# Patient Record
Sex: Male | Born: 1985 | Race: White | Hispanic: No | Marital: Married | Smoking: Never smoker
Health system: Southern US, Community
[De-identification: ages and names within clinical notes are randomized; demographics above are authoritative.]

## PROBLEM LIST (undated history)

## (undated) DIAGNOSIS — J45909 Unspecified asthma, uncomplicated: Secondary | ICD-10-CM

## (undated) DIAGNOSIS — T7840XA Allergy, unspecified, initial encounter: Secondary | ICD-10-CM

## (undated) DIAGNOSIS — M1990 Unspecified osteoarthritis, unspecified site: Secondary | ICD-10-CM

## (undated) HISTORY — DX: Unspecified osteoarthritis, unspecified site: M19.90

## (undated) HISTORY — PX: SPINE SURGERY: SHX786

## (undated) HISTORY — DX: Unspecified asthma, uncomplicated: J45.909

## (undated) HISTORY — DX: Allergy, unspecified, initial encounter: T78.40XA

---
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
## 2007-01-01 ENCOUNTER — Emergency Department (HOSPITAL_COMMUNITY): Admission: EM | Admit: 2007-01-01 | Discharge: 2007-01-01 | Payer: Self-pay | Admitting: Emergency Medicine

## 2010-11-02 NOTE — Op Note (Signed)
[place]  Patient:    Donald Chan, Donald Chan Visit Number: [id number] MRN: [id number]          Service Type: SUR Location: [id number] Attending Physician:  Barton Fanny Dictated by:   Hewitt Shorts, M.D. Proc. 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  OPERATION PERFORMED:  Right L4-5 lumbar laminotomy and microdiskectomy.  SURGEON:  Hewitt Shorts, M.D.  ANESTHESIA:  General endotracheal.  INDICATIONS FOR PROCEDURE:  The patient is a 25 year old male who presented with right lumbar radiculopathy and was found by MRI scan to have a large right L4-5 lumbar disk herniation.  The decision was made to proceed with elective laminotomy and microdiskectomy.  DESCRIPTION OF PROCEDURE:  The patient was brought to the operating room and placed under general endotracheal anesthesia.  The patient was turned to a prone position and the lumbar region was prepped with Betadine soap and solution and draped in a sterile fashion.  The midline was infiltrated with local anesthetic with epinephrine.  A midline incision was made after an x-ray had been taken for localization.  Dissection was carried down to the subcutaneous tissue through the lumbodorsal fascia, which was incised on the right side of the midline and the paraspinal muscles were dissected from the spinous process of the lamina in subperiosteal fashion.  The L4-5 level was identified by x-ray and laminotomy was performed using the [facility] Max drill. The microscope was draped and brought into the field to provide additional magnification, illumination and visualization.  The remainder of the procedure was performed using microdissection and microsurgical technique.  The ligamentum flavum was  carefully resected and the thecal sac and right L5 nerve root were identified.  The epidural space was examined and a large disk herniation identified.  The thecal sac and nerve root were gently retracted medially.  Epidural veins were coagulated as necessary with bipolar cautery and then the disk fragment was carefully dissected from the surrounding epidural tissues and removed in several large fragments.  We then entered into the disk space and removed additional degenerated disk material.  All of the disk herniation within the epidural space was removed with good decompression of the thecal sac and nerve root.  All loose fragments of disk material were removed also from within the disk space.  Bipolar cautery was used to establish hemostasis and once the diskectomy was completed.  Hemostasis established. We instilled 2 cc of fentanyl and 80 mg of Depo-Medrol into the epidural space and proceeded with closure.  The deep fascia was closed with interrupted undyed 0 Vicryl sutures.  The subcutaneous and subcuticular layer were closed with interrupted inverted 2-0 undyed Vicryl sutures.  The skin was reapproximated with Dermabond.  The patient tolerated the procedure well. Estimated blood loss was less than 50 cc.  The sponge and needle counts were correct.  Following surgery, the patient was turned back to the supine position to be reversed from anesthetic and extubated.  He was transferred to the recovery room for further care. Dictated by:   Hewitt Shorts, M.D. Attending Physician:  Barton Fanny DD:  12/28/01 TD:  12/30/01 Job: [id number]

## 2011-04-01 LAB — POCT CARDIAC MARKERS
CKMB, poc: 1.5
Myoglobin, poc: 70.8
Operator id: 272551
Troponin i, poc: <0.05

## 2011-04-01 LAB — D-DIMER, QUANTITATIVE: D-Dimer, Quant: <0.22

## 2015-07-03 ENCOUNTER — Ambulatory Visit (INDEPENDENT_AMBULATORY_CARE_PROVIDER_SITE_OTHER): Payer: Self-pay

## 2015-07-03 ENCOUNTER — Ambulatory Visit (INDEPENDENT_AMBULATORY_CARE_PROVIDER_SITE_OTHER): Payer: Self-pay | Admitting: Emergency Medicine

## 2015-07-03 VITALS — BP 142/98 | HR 95 | Temp 98.4°F | Resp 18 | Ht 73.5 in | Wt 342.0 lb

## 2015-07-03 DIAGNOSIS — S335XXA Sprain of ligaments of lumbar spine, initial encounter: Secondary | ICD-10-CM

## 2015-07-03 DIAGNOSIS — Z6841 Body Mass Index (BMI) 40.0 and over, adult: Secondary | ICD-10-CM | POA: Insufficient documentation

## 2015-07-03 DIAGNOSIS — S161XXA Strain of muscle, fascia and tendon at neck level, initial encounter: Secondary | ICD-10-CM

## 2015-07-03 MED ORDER — HYDROCODONE-ACETAMINOPHEN 5-325 MG PO TABS: 1.0000 | ORAL_TABLET | ORAL | Status: AC | PRN

## 2015-07-03 MED ORDER — CYCLOBENZAPRINE HCL 10 MG PO TABS: 10.0000 mg | ORAL_TABLET | Freq: Three times a day (TID) | ORAL | Status: AC | PRN

## 2015-07-03 MED ORDER — NAPROXEN SODIUM 550 MG PO TABS: 550.0000 mg | ORAL_TABLET | Freq: Two times a day (BID) | ORAL | Status: AC
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
## 2015-07-03 NOTE — Patient Instructions (Signed)
Lumbosacral Strain  Lumbosacral strain is a strain of any of the parts that make up your lumbosacral vertebrae. Your lumbosacral vertebrae are the bones that make up the lower third of your backbone. Your lumbosacral vertebrae are held together by muscles and tough, fibrous tissue (ligaments).   CAUSES   A sudden blow to your back can cause lumbosacral strain. Also, anything that causes an excessive stretch of the muscles in the low back can cause this strain. This is typically seen when people exert themselves strenuously, fall, lift heavy objects, bend, or crouch repeatedly.  RISK FACTORS   Physically demanding work.   Participation in pushing or pulling sports or sports that require a sudden twist of the back (tennis, golf, baseball).   Weight lifting.   Excessive lower back curvature.   Forward-tilted pelvis.   Weak back or abdominal muscles or both.   Tight hamstrings.  SIGNS AND SYMPTOMS   Lumbosacral strain may cause pain in the area of your injury or pain that moves (radiates) down your leg.   DIAGNOSIS  Your health care provider can often diagnose lumbosacral strain through a physical exam. In some cases, you may need tests such as X-ray exams.   TREATMENT   Treatment for your lower back injury depends on many factors that your clinician will have to evaluate. However, most treatment will include the use of anti-inflammatory medicines.  HOME CARE INSTRUCTIONS    Avoid hard physical activities (tennis, racquetball, waterskiing) if you are not in proper physical condition for it. This may aggravate or create problems.   If you have a back problem, avoid sports requiring sudden body movements. Swimming and walking are generally safer activities.   Maintain good posture.   Maintain a healthy weight.   For acute conditions, you may put ice on the injured area.    Put ice in a plastic bag.    Place a towel between your skin and the bag.    Leave the ice on for 20 minutes, 2-3 times a day.   When the  low back starts healing, stretching and strengthening exercises may be recommended.  SEEK MEDICAL CARE IF:   Your back pain is getting worse.   You experience severe back pain not relieved with medicines.  SEEK IMMEDIATE MEDICAL CARE IF:    You have numbness, tingling, weakness, or problems with the use of your arms or legs.   There is a change in bowel or bladder control.   You have increasing pain in any area of the body, including your belly (abdomen).   You notice shortness of breath, dizziness, or feel faint.   You feel sick to your stomach (nauseous), are throwing up (vomiting), or become sweaty.   You notice discoloration of your toes or legs, or your feet get very cold.  MAKE SURE YOU:    Understand these instructions.   Will watch your condition.   Will get help right away if you are not doing well or get worse.     This information is not intended to replace advice given to you by your health care provider. Make sure you discuss any questions you have with your health care provider.     Document Released: 03/13/2005 Document Revised: 06/24/2014 Document Reviewed: 01/20/2013  Elsevier Interactive Patient Education 2016 [employer]      Cervical Sprain  A cervical sprain is an injury in the neck in which the strong, fibrous tissues (ligaments) that connect your neck bones stretch or tear.   Cervical sprains can range from mild to severe. Severe cervical sprains can cause the neck vertebrae to be unstable. This can lead to damage of the spinal cord and can result in serious nervous system problems. The amount of time it takes for a cervical sprain to get better depends on the cause and extent of the injury. Most cervical sprains heal in 1 to 3 weeks.  CAUSES   Severe cervical sprains may be caused by:    Contact sport injuries (such as from football, rugby, wrestling, hockey, auto racing, gymnastics, diving, martial arts, or boxing).    Motor vehicle collisions.    Whiplash injuries. This is  an injury from a sudden forward and backward whipping movement of the head and neck.   Falls.   Mild cervical sprains may be caused by:    Being in an awkward position, such as while cradling a telephone between your ear and shoulder.    Sitting in a chair that does not offer proper support.    Working at a poorly designed computer station.    Looking up or down for long periods of time.   SYMPTOMS    Pain, soreness, stiffness, or a burning sensation in the front, back, or sides of the neck. This discomfort may develop immediately after the injury or slowly, 24 hours or more after the injury.    Pain or tenderness directly in the middle of the back of the neck.    Shoulder or upper back pain.    Limited ability to move the neck.    Headache.    Dizziness.    Weakness, numbness, or tingling in the hands or arms.    Muscle spasms.    Difficulty swallowing or chewing.    Tenderness and swelling of the neck.   DIAGNOSIS   Most of the time your health care provider can diagnose a cervical sprain by taking your history and doing a physical exam. Your health care provider will ask about previous neck injuries and any known neck problems, such as arthritis in the neck. X-rays may be taken to find out if there are any other problems, such as with the bones of the neck. Other tests, such as a CT scan or MRI, may also be needed.   TREATMENT   Treatment depends on the severity of the cervical sprain. Mild sprains can be treated with rest, keeping the neck in place (immobilization), and pain medicines. Severe cervical sprains are immediately immobilized. Further treatment is done to help with pain, muscle spasms, and other symptoms and may include:   Medicines, such as pain relievers, numbing medicines, or muscle relaxants.    Physical therapy. This may involve stretching exercises, strengthening exercises, and posture training. Exercises and improved posture can help stabilize the neck,  strengthen muscles, and help stop symptoms from returning.   HOME CARE INSTRUCTIONS    Put ice on the injured area.     Put ice in a plastic bag.     Place a towel between your skin and the bag.     Leave the ice on for 15-20 minutes, 3-4 times a day.    If your injury was severe, you may have been given a cervical collar to wear. A cervical collar is a two-piece collar designed to keep your neck from moving while it heals.    Do not remove the collar unless instructed by your health care provider.    If you have long hair, keep it outside of   the collar.    Ask your health care provider before making any adjustments to your collar. Minor adjustments may be required over time to improve comfort and reduce pressure on your chin or on the back of your head.    Ifyou are allowed to remove the collar for cleaning or bathing, follow your health care provider's instructions on how to do so safely.    Keep your collar clean by wiping it with mild soap and water and drying it completely. If the collar you have been given includes removable pads, remove them every 1-2 days and hand wash them with soap and water. Allow them to air dry. They should be completely dry before you wear them in the collar.    If you are allowed to remove the collar for cleaning and bathing, wash and dry the skin of your neck. Check your skin for irritation or sores. If you see any, tell your health care provider.    Do not drive while wearing the collar.    Only take over-the-counter or prescription medicines for pain, discomfort, or fever as directed by your health care provider.    Keep all follow-up appointments as directed by your health care provider.    Keep all physical therapy appointments as directed by your health care provider.    Make any needed adjustments to your workstation to promote good posture.    Avoid positions and activities that make your symptoms worse.    Warm up and stretch before being active to help  prevent problems.   SEEK MEDICAL CARE IF:    Your pain is not controlled with medicine.    You are unable to decrease your pain medicine over time as planned.    Your activity level is not improving as expected.   SEEK IMMEDIATE MEDICAL CARE IF:    You develop any bleeding.   You develop stomach upset.   You have signs of an allergic reaction to your medicine.    Your symptoms get worse.    You develop new, unexplained symptoms.    You have numbness, tingling, weakness, or paralysis in any part of your body.   MAKE SURE YOU:    Understand these instructions.   Will watch your condition.   Will get help right away if you are not doing well or get worse.     This information is not intended to replace advice given to you by your health care provider. Make sure you discuss any questions you have with your health care provider.     Document Released: 03/31/2007 Document Revised: 06/08/2013 Document Reviewed: 12/09/2012  Elsevier Interactive Patient Education 2016 [employer]
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
